# Patient Record
Sex: Male | Born: 1955
Health system: Southern US, Community
[De-identification: ages and names within clinical notes are randomized; demographics above are authoritative.]

## PROBLEM LIST (undated history)

## (undated) DIAGNOSIS — E119 Type 2 diabetes mellitus without complications: Secondary | ICD-10-CM

## (undated) DIAGNOSIS — I10 Essential (primary) hypertension: Secondary | ICD-10-CM

## (undated) HISTORY — DX: Essential (primary) hypertension: I10

## (undated) HISTORY — PX: FRACTURE SURGERY: SHX138

## (undated) HISTORY — DX: Type 2 diabetes mellitus without complications: E11.9

---

## 2013-12-01 DIAGNOSIS — Z0000 Encounter for general adult medical examination without abnormal findings: Secondary | ICD-10-CM | POA: Diagnosis not present

## 2013-12-01 DIAGNOSIS — I10 Essential (primary) hypertension: Secondary | ICD-10-CM | POA: Diagnosis not present

## 2013-12-01 DIAGNOSIS — Z125 Encounter for screening for malignant neoplasm of prostate: Secondary | ICD-10-CM | POA: Diagnosis not present

## 2013-12-01 DIAGNOSIS — Z139 Encounter for screening, unspecified: Secondary | ICD-10-CM | POA: Diagnosis not present

## 2013-12-01 DIAGNOSIS — Z1211 Encounter for screening for malignant neoplasm of colon: Secondary | ICD-10-CM | POA: Diagnosis not present

## 2013-12-01 DIAGNOSIS — Z2889 Immunization not carried out for other reason: Secondary | ICD-10-CM | POA: Diagnosis not present

## 2013-12-01 DIAGNOSIS — Z1339 Encounter for screening examination for other mental health and behavioral disorders: Secondary | ICD-10-CM | POA: Diagnosis not present

## 2013-12-01 DIAGNOSIS — Z1331 Encounter for screening for depression: Secondary | ICD-10-CM | POA: Diagnosis not present

## 2013-12-01 DIAGNOSIS — Z136 Encounter for screening for cardiovascular disorders: Secondary | ICD-10-CM | POA: Diagnosis not present

## 2013-12-01 DIAGNOSIS — E119 Type 2 diabetes mellitus without complications: Secondary | ICD-10-CM | POA: Diagnosis not present

## 2013-12-01 DIAGNOSIS — Z2821 Immunization not carried out because of patient refusal: Secondary | ICD-10-CM | POA: Diagnosis not present

## 2013-12-01 DIAGNOSIS — Z6829 Body mass index (BMI) 29.0-29.9, adult: Secondary | ICD-10-CM | POA: Diagnosis not present

## 2013-12-13 ENCOUNTER — Ambulatory Visit (INDEPENDENT_AMBULATORY_CARE_PROVIDER_SITE_OTHER): Payer: Medicare Other

## 2013-12-13 ENCOUNTER — Ambulatory Visit (INDEPENDENT_AMBULATORY_CARE_PROVIDER_SITE_OTHER): Payer: Medicare Other | Admitting: Internal Medicine

## 2013-12-13 VITALS — BP 140/88 | HR 65 | Temp 98.1°F | Resp 18 | Ht 70.5 in | Wt 208.0 lb

## 2013-12-13 DIAGNOSIS — Z1211 Encounter for screening for malignant neoplasm of colon: Secondary | ICD-10-CM | POA: Diagnosis not present

## 2013-12-13 DIAGNOSIS — M545 Low back pain, unspecified: Secondary | ICD-10-CM

## 2013-12-13 DIAGNOSIS — Z0000 Encounter for general adult medical examination without abnormal findings: Secondary | ICD-10-CM

## 2013-12-13 DIAGNOSIS — Z125 Encounter for screening for malignant neoplasm of prostate: Secondary | ICD-10-CM

## 2013-12-13 DIAGNOSIS — M25579 Pain in unspecified ankle and joints of unspecified foot: Secondary | ICD-10-CM

## 2013-12-13 DIAGNOSIS — M25572 Pain in left ankle and joints of left foot: Secondary | ICD-10-CM

## 2013-12-13 DIAGNOSIS — Z23 Encounter for immunization: Secondary | ICD-10-CM

## 2013-12-13 DIAGNOSIS — G8929 Other chronic pain: Secondary | ICD-10-CM | POA: Diagnosis not present

## 2013-12-13 DIAGNOSIS — E1165 Type 2 diabetes mellitus with hyperglycemia: Secondary | ICD-10-CM

## 2013-12-13 DIAGNOSIS — E119 Type 2 diabetes mellitus without complications: Secondary | ICD-10-CM | POA: Diagnosis not present

## 2013-12-13 LAB — POCT URINALYSIS DIPSTICK
Bilirubin, UA: NEGATIVE
Glucose, UA: 250
Ketones, UA: NEGATIVE
LEUKOCYTES UA: NEGATIVE
NITRITE UA: NEGATIVE
PH UA: 7
Protein, UA: 30
Spec Grav, UA: 1.02
UROBILINOGEN UA: 0.2

## 2013-12-13 LAB — POCT CBC
GRANULOCYTE PERCENT: 58.2 % (ref 37–80)
HCT, POC: 52.1 % (ref 43.5–53.7)
Hemoglobin: 16.7 g/dL (ref 14.1–18.1)
Lymph, poc: 2.7 (ref 0.6–3.4)
MCH: 28.5 pg (ref 27–31.2)
MCHC: 32.1 g/dL (ref 31.8–35.4)
MCV: 88.7 fL (ref 80–97)
MID (CBC): 0.2 (ref 0–0.9)
MPV: 7.5 fL (ref 0–99.8)
PLATELET COUNT, POC: 335 10*3/uL (ref 142–424)
POC Granulocyte: 4.1 (ref 2–6.9)
POC LYMPH %: 38.6 % (ref 10–50)
POC MID %: 3.2 % (ref 0–12)
RBC: 5.87 M/uL (ref 4.69–6.13)
RDW, POC: 13.2 %
WBC: 7.1 10*3/uL (ref 4.6–10.2)

## 2013-12-13 LAB — POCT UA - MICROSCOPIC ONLY
Bacteria, U Microscopic: NEGATIVE
CASTS, UR, LPF, POC: NEGATIVE
Crystals, Ur, HPF, POC: NEGATIVE
EPITHELIAL CELLS, URINE PER MICROSCOPY: NEGATIVE
Yeast, UA: NEGATIVE

## 2013-12-13 LAB — COMPREHENSIVE METABOLIC PANEL
ALK PHOS: 72 U/L (ref 39–117)
ALT: 32 U/L (ref 0–53)
AST: 19 U/L (ref 0–37)
Albumin: 4.7 g/dL (ref 3.5–5.2)
BILIRUBIN TOTAL: 0.7 mg/dL (ref 0.2–1.2)
BUN: 10 mg/dL (ref 6–23)
CO2: 32 mEq/L (ref 19–32)
CREATININE: 0.94 mg/dL (ref 0.50–1.35)
Calcium: 10.5 mg/dL (ref 8.4–10.5)
Chloride: 99 mEq/L (ref 96–112)
Glucose, Bld: 239 mg/dL — ABNORMAL HIGH (ref 70–99)
Potassium: 4.5 mEq/L (ref 3.5–5.3)
Sodium: 137 mEq/L (ref 135–145)
Total Protein: 8 g/dL (ref 6.0–8.3)

## 2013-12-13 LAB — LIPID PANEL
CHOL/HDL RATIO: 3.2 ratio
Cholesterol: 170 mg/dL (ref 0–200)
HDL: 53 mg/dL (ref >39)
LDL CALC: 86 mg/dL (ref 0–99)
Triglycerides: 156 mg/dL — ABNORMAL HIGH (ref <150)
VLDL: 31 mg/dL (ref 0–40)

## 2013-12-13 LAB — POCT GLYCOSYLATED HEMOGLOBIN (HGB A1C): HEMOGLOBIN A1C: 7.6

## 2013-12-13 LAB — GLUCOSE, POCT (MANUAL RESULT ENTRY): POC Glucose: 235 mg/dl — AB (ref 70–99)

## 2013-12-13 LAB — TSH: TSH: 0.937 u[IU]/mL (ref 0.350–4.500)

## 2013-12-13 NOTE — Progress Notes (Signed)
   Subjective:    Patient ID: Hector Wagner, male    DOB: 1955/08/11, 58 y.o.   MRN: [id number]  HPI    Review of Systems     Objective:   Physical Exam        Assessment & Plan:

## 2013-12-13 NOTE — Patient Instructions (Addendum)
Diabetes and Foot Care Diabetes may cause you to have problems because of poor blood supply (circulation) to your feet and legs. This may cause the skin on your feet to become thinner, break easier, and heal more slowly. Your skin may become dry, and the skin may peel and crack. You may also have nerve damage in your legs and feet causing decreased feeling in them. You may not notice minor injuries to your feet that could lead to infections or more serious problems. Taking care of your feet is one of the most important things you can do for yourself.  HOME CARE INSTRUCTIONS  Wear shoes at all times, even in the house. Do not go barefoot. Bare feet are easily injured.  Check your feet daily for blisters, cuts, and redness. If you cannot see the bottom of your feet, use a mirror or ask someone for help.  Wash your feet with warm water (do not use hot water) and mild soap. Then pat your feet and the areas between your toes until they are completely dry. Do not soak your feet as this can dry your skin.  Apply a moisturizing lotion or petroleum jelly (that does not contain alcohol and is unscented) to the skin on your feet and to dry, brittle toenails. Do not apply lotion between your toes.  Trim your toenails straight across. Do not dig under them or around the cuticle. File the edges of your nails with an emery board or nail file.  Do not cut corns or calluses or try to remove them with medicine.  Wear clean socks or stockings every day. Make sure they are not too tight. Do not wear knee-high stockings since they may decrease blood flow to your legs.  Wear shoes that fit properly and have enough cushioning. To break in new shoes, wear them for just a few hours a day. This prevents you from injuring your feet. Always look in your shoes before you put them on to be sure there are no objects inside.  Do not cross your legs. This may decrease the blood flow to your feet.  If you find a minor scrape,  cut, or break in the skin on your feet, keep it and the skin around it clean and dry. These areas may be cleansed with mild soap and water. Do not cleanse the area with peroxide, alcohol, or iodine.  When you remove an adhesive bandage, be sure not to damage the skin around it.  If you have a wound, look at it several times a day to make sure it is healing.  Do not use heating pads or hot water bottles. They may burn your skin. If you have lost feeling in your feet or legs, you may not know it is happening until it is too late.  Make sure your health care provider performs a complete foot exam at least annually or more often if you have foot problems. Report any cuts, sores, or bruises to your health care provider immediately. SEEK MEDICAL CARE IF:   You have an injury that is not healing.  You have cuts or breaks in the skin.  You have an ingrown nail.  You notice redness on your legs or feet.  You feel burning or tingling in your legs or feet.  You have pain or cramps in your legs and feet.  Your legs or feet are numb.  Your feet always feel cold. SEEK IMMEDIATE MEDICAL CARE IF:   There is increasing redness,   swelling, or pain in or around a wound.  There is a red line that goes up your leg.  Pus is coming from a wound.  You develop a fever or as directed by your health care provider.  You notice a bad smell coming from an ulcer or wound. Document Released: 04/05/2000 Document Revised: 12/09/2012 Document Reviewed: 09/15/2012 [facility] Patient Information 2015 [place], [place]. This information is not intended to replace advice given to you by your health care provider. Make sure you discuss any questions you have with your health care provider. Diabetes and Exercise Exercising regularly is important. It is not just about losing weight. It has many health benefits, such as:  Improving your overall fitness, flexibility, and endurance.  Increasing your bone  density.  Helping with weight control.  Decreasing your body fat.  Increasing your muscle strength.  Reducing stress and tension.  Improving your overall health. People with diabetes who exercise gain additional benefits because exercise:  Reduces appetite.  Improves the body's use of blood sugar (glucose).  Helps lower or control blood glucose.  Decreases blood pressure.  Helps control blood lipids (such as cholesterol and triglycerides).  Improves the body's use of the hormone insulin by:  Increasing the body's insulin sensitivity.  Reducing the body's insulin needs.  Decreases the risk for heart disease because exercising:  Lowers cholesterol and triglycerides levels.  Increases the levels of good cholesterol (such as high-density lipoproteins [HDL]) in the body.  Lowers blood glucose levels. YOUR ACTIVITY PLAN  Choose an activity that you enjoy and set realistic goals. Your health care provider or diabetes educator can help you make an activity plan that works for you. Exercise regularly as directed by your health care provider. This includes:  Performing resistance training twice a week such as push-ups, sit-ups, lifting weights, or using resistance bands.  Performing 150 minutes of cardio exercises each week such as walking, running, or playing sports.  Staying active and spending no more than 90 minutes at one time being inactive. Even short bursts of exercise are good for you. Three 10-minute sessions spread throughout the day are just as beneficial as a single 30-minute session. Some exercise ideas include:  Taking the dog for a walk.  Taking the stairs instead of the elevator.  Dancing to your favorite song.  Doing an exercise video.  Doing your favorite exercise with a friend. RECOMMENDATIONS FOR EXERCISING WITH TYPE 1 OR TYPE 2 DIABETES   Check your blood glucose before exercising. If blood glucose levels are greater than 240 mg/dL, check for urine  ketones. Do not exercise if ketones are present.  Avoid injecting insulin into areas of the body that are going to be exercised. For example, avoid injecting insulin into:  The arms when playing tennis.  The legs when jogging.  Keep a record of:  Food intake before and after you exercise.  Expected peak times of insulin action.  Blood glucose levels before and after you exercise.  The type and amount of exercise you have done.  Review your records with your health care provider. Your health care provider will help you to develop guidelines for adjusting food intake and insulin amounts before and after exercising.  If you take insulin or oral hypoglycemic agents, watch for signs and symptoms of hypoglycemia. They include:  Dizziness.  Shaking.  Sweating.  Chills.  Confusion.  Drink plenty of water while you exercise to prevent dehydration or heat stroke. Body water is lost during exercise and must be  replaced.  Talk to your health care provider before starting an exercise program to make sure it is safe for you. Remember, almost any type of activity is better than none. Document Released: 06/29/2003 Document Revised: 08/23/2013 Document Reviewed: 09/15/2012 [facility] Patient Information 2015 [place], [place]. This information is not intended to replace advice given to you by your health care provider. Make sure you discuss any questions you have with your health care provider. Cholesterol Cholesterol is a white, waxy, fat-like substance needed by your body in small amounts. The liver makes all the cholesterol you need. Cholesterol is carried from the liver by the blood through the blood vessels. Deposits of cholesterol (plaque) may build up on blood vessel walls. These make the arteries narrower and stiffer. Cholesterol plaques increase the risk for heart attack and stroke.  You cannot feel your cholesterol level even if it is very high. The only way to know it is high is with a  blood test. Once you know your cholesterol levels, you should keep a record of the test results. Work with your health care provider to keep your levels in the desired range.  WHAT DO THE RESULTS MEAN?  Total cholesterol is a rough measure of all the cholesterol in your blood.   LDL is the so-called bad cholesterol. This is the type that deposits cholesterol in the walls of the arteries. You want this level to be low.   HDL is the good cholesterol because it cleans the arteries and carries the LDL away. You want this level to be high.  Triglycerides are fat that the body can either burn for energy or store. High levels are closely linked to heart disease.  WHAT ARE THE DESIRED LEVELS OF CHOLESTEROL?  Total cholesterol below 200.   LDL below 100 for people at risk, below 70 for those at very high risk.   HDL above 50 is good, above 60 is best.   Triglycerides below 150.  HOW CAN I LOWER MY CHOLESTEROL?  Diet. Follow your diet programs as directed by your health care provider.   Choose fish or white meat chicken and [place], roasted or baked. Limit fatty cuts of red meat, fried foods, and processed meats, such as sausage and lunch meats.   Eat lots of fresh fruits and vegetables.  Choose whole grains, beans, pasta, potatoes, and cereals.   Use only small amounts of olive, corn, or canola oils.   Avoid butter, mayonnaise, shortening, or palm kernel oils.  Avoid foods with trans fats.   Drink skim or nonfat milk and eat low-fat or nonfat yogurt and cheeses. Avoid whole milk, cream, ice cream, egg yolks, and full-fat cheeses.   Healthy desserts include angel food cake, ginger snaps, animal crackers, hard candy, popsicles, and low-fat or nonfat frozen yogurt. Avoid pastries, cakes, pies, and cookies.   Exercise. Follow your exercise programs as directed by your health care provider.   A regular program helps decrease LDL and raise HDL.   A regular program helps  with weight control.   Do things that increase your activity level like gardening, walking, or taking the stairs. Ask your health care provider about how you can be more active in your daily life.   Medicine. Take medicine only as directed by your health care provider.   Medicine may be prescribed by your health care provider to help lower cholesterol and decrease the risk for heart disease.   If you have several risk factors, you may need medicine even if  your levels are normal. Document Released: 01/01/2001 Document Revised: 08/23/2013 Document Reviewed: 01/20/2013 [facility] Patient Information 2015 [place], [place]. This information is not intended to replace advice given to you by your health care provider. Make sure you discuss any questions you have with your health care provider. Colonoscopy A colonoscopy is an exam to look at the entire large intestine (colon). This exam can help find problems such as tumors, polyps, inflammation, and areas of bleeding. The exam takes about 1 hour.  LET [facility] CARE PROVIDER KNOW ABOUT:   Any allergies you have.  All medicines you are taking, including vitamins, herbs, eye drops, creams, and over-the-counter medicines.  Previous problems you or members of your family have had with the use of anesthetics.  Any blood disorders you have.  Previous surgeries you have had.  Medical conditions you have. RISKS AND COMPLICATIONS  Generally, this is a safe procedure. However, as with any procedure, complications can occur. Possible complications include:  Bleeding.  Tearing or rupture of the colon wall.  Reaction to medicines given during the exam.  Infection (rare). BEFORE THE PROCEDURE   Ask your health care provider about changing or stopping your regular medicines.  You may be prescribed an oral bowel prep. This involves drinking a large amount of medicated liquid, starting the day before your procedure. The liquid will cause you to  have multiple loose stools until your stool is almost clear or light green. This cleans out your colon in preparation for the procedure.  Do not eat or drink anything else once you have started the bowel prep, unless your health care provider tells you it is safe to do so.  Arrange for someone to drive you home after the procedure. PROCEDURE   You will be given medicine to help you relax (sedative).  You will lie on your side with your knees bent.  A long, flexible tube with a light and camera on the end (colonoscope) will be inserted through the rectum and into the colon. The camera sends video back to a computer screen as it moves through the colon. The colonoscope also releases carbon dioxide gas to inflate the colon. This helps your health care provider see the area better.  During the exam, your health care provider may take a small tissue sample (biopsy) to be examined under a microscope if any abnormalities are found.  The exam is finished when the entire colon has been viewed. AFTER THE PROCEDURE   Do not drive for 24 hours after the exam.  You may have a small amount of blood in your stool.  You may pass moderate amounts of gas and have mild abdominal cramping or bloating. This is caused by the gas used to inflate your colon during the exam.  Ask when your test results will be ready and how you will get your results. Make sure you get your test results. Document Released: 04/05/2000 Document Revised: 01/27/2013 Document Reviewed: 12/14/2012 [facility] Patient Information 2015 [place], [place]. This information is not intended to replace advice given to you by your health care provider. Make sure you discuss any questions you have with your health care provider.

## 2013-12-13 NOTE — Progress Notes (Signed)
Subjective:    Patient ID: Hector Wagner, male    DOB: 07-Sep-1955, 58 y.o.   MRN: [id number]  HPI Pt is from [place] in the [place]. He went to a clinic there that he can not remember the name of to get his metformin. Has not had a colonoscopy or a PSA before. DDD lumbar spine. Pt takes metformin due to being a T2D says blood sugar stays around the 170's. Pt states he fell from a roof 3 years ago and hurt his left leg. He is using a cane to get around. His left leg bothers him on and off. Underneath the right foot has a burning sensation, feels like he can't rest his foot down. When he starts to walk he no longer feels pain on it. Pt says he hasn't had a tetanus shot in 20 years and has never had an pneumonia vaccine.     His mother is 38 yrs and active, father died 43 yo Review of Systems  Constitutional: Negative.   HENT: Negative.   Eyes: Positive for visual disturbance.  Respiratory: Negative.   Cardiovascular: Negative.   Gastrointestinal: Negative.   Endocrine: Negative.   Genitourinary: Negative.   Musculoskeletal: Positive for arthralgias, back pain and gait problem.  Skin: Negative.   Allergic/Immunologic: Negative.   Neurological: Positive for numbness.  Hematological: Negative.   Psychiatric/Behavioral: Negative.    No fhx of cancer    Objective:   Physical Exam  Vitals reviewed. Constitutional: He is oriented to person, place, and time. He appears well-developed and well-nourished. No distress.  HENT:  Head: Normocephalic.  Right Ear: External ear normal.  Left Ear: External ear normal.  Nose: Nose normal.  Mouth/Throat: Oropharynx is clear and moist.  Eyes: Conjunctivae and EOM are normal. Pupils are equal, round, and reactive to light.  Neck: Normal range of motion. Neck supple. No tracheal deviation present. No thyromegaly present.  Cardiovascular: Normal rate, regular rhythm, normal heart sounds and intact distal pulses.   Pulmonary/Chest: Effort  normal and breath sounds normal.  Abdominal: Soft. Bowel sounds are normal. There is no tenderness.  Genitourinary: Rectum normal, prostate normal and penis normal.  Musculoskeletal: He exhibits tenderness.       Left ankle: He exhibits decreased range of motion, swelling, deformity and abnormal pulse. He exhibits no ecchymosis and no laceration. Tenderness. Achilles tendon normal.       Lumbar back: He exhibits tenderness, bony tenderness, pain and spasm. He exhibits normal range of motion, no swelling, no edema, no deformity, no laceration and normal pulse.  Hx of fx with repair plates and screws 3 yrs ago  Lymphadenopathy:    He has no cervical adenopathy.  Neurological: He is alert and oriented to person, place, and time. He has normal strength. A sensory deficit is present. No cranial nerve deficit. He exhibits normal muscle tone. Coordination and gait abnormal.  Skin: No rash noted.  Psychiatric: He has a normal mood and affect. His behavior is normal. Judgment and thought content normal.   [facility] reading (PRIMARY) by  Dr Leretha Dykes and LS spine normal except for minor scoliosis.  Results for orders placed in visit on 12/13/13  POCT CBC      Result Value Ref Range   WBC 7.1  4.6 - 10.2 K/uL   Lymph, poc 2.7  0.6 - 3.4   POC LYMPH PERCENT 38.6  10 - 50 %L   MID (cbc) 0.2  0 - 0.9   POC MID % 3.2  0 - 12 %M   POC Granulocyte 4.1  2 - 6.9   Granulocyte percent 58.2  37 - 80 %G   RBC 5.87  4.69 - 6.13 M/uL   Hemoglobin 16.7  14.1 - 18.1 g/dL   HCT, POC 52.1  43.5 - 53.7 %   MCV 88.7  80 - 97 fL   MCH, POC 28.5  27 - 31.2 pg   MCHC 32.1  31.8 - 35.4 g/dL   RDW, POC 13.2     Platelet Count, POC 335  142 - 424 K/uL   MPV 7.5  0 - 99.8 fL  GLUCOSE, POCT (MANUAL RESULT ENTRY)      Result Value Ref Range   POC Glucose 235 (*) 70 - 99 mg/dl  POCT GLYCOSYLATED HEMOGLOBIN (HGB A1C)      Result Value Ref Range   Hemoglobin A1C 7.6    POCT UA - MICROSCOPIC ONLY      Result Value Ref  Range   WBC, Ur, HPF, POC 0-2     RBC, urine, microscopic 2-5     Bacteria, U Microscopic neg     Mucus, UA trace     Epithelial cells, urine per micros neg     Crystals, Ur, HPF, POC neg     Casts, Ur, LPF, POC neg     Yeast, UA neg    POCT URINALYSIS DIPSTICK      Result Value Ref Range   Color, UA yellow     Clarity, UA clear     Glucose, UA 250     Bilirubin, UA neg     Ketones, UA neg     Spec Grav, UA 1.020     Blood, UA trace-lysed     pH, UA 7.0     Protein, UA 30     Urobilinogen, UA 0.2     Nitrite, UA neg     Leukocytes, UA Negative            Assessment & Plan:  T2D not to goal Chronic LBP Chronic left ankle pain from old fx  Schedule colonoscopy with GI Rec increase metformin 1g bid Rec start lipitor and 81mg  asa  Discuss with you daughter the nurse

## 2013-12-14 DIAGNOSIS — Z125 Encounter for screening for malignant neoplasm of prostate: Secondary | ICD-10-CM | POA: Diagnosis not present

## 2013-12-14 DIAGNOSIS — Z23 Encounter for immunization: Secondary | ICD-10-CM | POA: Diagnosis not present

## 2013-12-14 DIAGNOSIS — M25579 Pain in unspecified ankle and joints of unspecified foot: Secondary | ICD-10-CM | POA: Diagnosis not present

## 2013-12-14 DIAGNOSIS — Z0000 Encounter for general adult medical examination without abnormal findings: Secondary | ICD-10-CM | POA: Diagnosis not present

## 2013-12-14 DIAGNOSIS — E119 Type 2 diabetes mellitus without complications: Secondary | ICD-10-CM | POA: Diagnosis not present

## 2013-12-14 DIAGNOSIS — M545 Low back pain, unspecified: Secondary | ICD-10-CM | POA: Diagnosis not present

## 2013-12-14 DIAGNOSIS — G8929 Other chronic pain: Secondary | ICD-10-CM | POA: Diagnosis not present

## 2013-12-14 DIAGNOSIS — Z1211 Encounter for screening for malignant neoplasm of colon: Secondary | ICD-10-CM | POA: Diagnosis not present

## 2013-12-14 LAB — PSA, MEDICARE: PSA: 2.65 ng/mL (ref <=4.00)

## 2013-12-14 LAB — IFOBT (OCCULT BLOOD): IMMUNOLOGICAL FECAL OCCULT BLOOD TEST: NEGATIVE

## 2013-12-16 ENCOUNTER — Encounter: Payer: Self-pay | Admitting: Internal Medicine

## 2013-12-17 ENCOUNTER — Ambulatory Visit (INDEPENDENT_AMBULATORY_CARE_PROVIDER_SITE_OTHER): Payer: Medicare Other | Admitting: Internal Medicine

## 2013-12-17 VITALS — BP 140/90 | HR 67 | Temp 98.0°F | Resp 16 | Ht 61.0 in | Wt 208.0 lb

## 2013-12-17 DIAGNOSIS — E1142 Type 2 diabetes mellitus with diabetic polyneuropathy: Secondary | ICD-10-CM | POA: Diagnosis not present

## 2013-12-17 DIAGNOSIS — E1340 Other specified diabetes mellitus with diabetic neuropathy, unspecified: Secondary | ICD-10-CM

## 2013-12-17 DIAGNOSIS — E1140 Type 2 diabetes mellitus with diabetic neuropathy, unspecified: Secondary | ICD-10-CM | POA: Insufficient documentation

## 2013-12-17 DIAGNOSIS — E1149 Type 2 diabetes mellitus with other diabetic neurological complication: Secondary | ICD-10-CM

## 2013-12-17 DIAGNOSIS — G5793 Unspecified mononeuropathy of bilateral lower limbs: Secondary | ICD-10-CM

## 2013-12-17 DIAGNOSIS — Z79899 Other long term (current) drug therapy: Secondary | ICD-10-CM

## 2013-12-17 DIAGNOSIS — G609 Hereditary and idiopathic neuropathy, unspecified: Secondary | ICD-10-CM

## 2013-12-17 DIAGNOSIS — E1165 Type 2 diabetes mellitus with hyperglycemia: Secondary | ICD-10-CM

## 2013-12-17 DIAGNOSIS — IMO0001 Reserved for inherently not codable concepts without codable children: Secondary | ICD-10-CM | POA: Diagnosis not present

## 2013-12-17 LAB — GLUCOSE, POCT (MANUAL RESULT ENTRY): POC Glucose: 154 mg/dl — AB (ref 70–99)

## 2013-12-17 MED ORDER — METFORMIN HCL 1000 MG PO TABS: 1000.0000 mg | ORAL_TABLET | Freq: Two times a day (BID) | ORAL | Status: AC

## 2013-12-17 NOTE — Patient Instructions (Signed)
Metformin tablets What is this medicine? METFORMIN (met FOR min) is used to treat type 2 diabetes. It helps to control blood sugar. Treatment is combined with diet and exercise. This medicine can be used alone or with other medicines for diabetes. This medicine may be used for other purposes; ask your health care provider or pharmacist if you have questions. COMMON BRAND NAME(S): Glucophage What should I tell my health care provider before I take this medicine? They need to know if you have any of these conditions: -anemia -frequently drink alcohol-containing beverages -become easily dehydrated -heart attack -heart failure that is treated with medications -kidney disease -liver disease -polycystic ovary syndrome -serious infection or injury -vomiting -an unusual or allergic reaction to metformin, other medicines, foods, dyes, or preservatives -pregnant or trying to get pregnant -breast-feeding How should I use this medicine? Take this medicine by mouth. Take it with meals. Swallow the tablets with a drink of water. Follow the directions on the prescription label. Take your medicine at regular intervals. Do not take your medicine more often than directed. Talk to your pediatrician regarding the use of this medicine in children. While this drug may be prescribed for children as young as 10 years of age for selected conditions, precautions do apply. Overdosage: If you think you have taken too much of this medicine contact a poison control center or emergency room at once. NOTE: This medicine is only for you. Do not share this medicine with others. What if I miss a dose? If you miss a dose, take it as soon as you can. If it is almost time for your next dose, take only that dose. Do not take double or extra doses. What may interact with this medicine? Do not take this medicine with any of the following medications: -dofetilide -gatifloxacin -certain contrast medicines given before X-rays,  CT scans, MRI, or other procedures This medicine may also interact with the following medications: -digoxin -diuretics -male hormones, like estrogens or progestins and birth control pills -isoniazid -medicines for blood pressure, heart disease, irregular heart beat -morphine -nicotinic acid -phenothiazines like chlorpromazine, mesoridazine, prochlorperazine, thioridazine -phenytoin -procainamide -quinidine -quinine -ranitidine -steroid medicines like prednisone or cortisone -stimulant medicines for attention disorders, weight loss, or to stay awake -thyroid medicines -trimethoprim -vancomycin This list may not describe all possible interactions. Give your health care provider a list of all the medicines, herbs, non-prescription drugs, or dietary supplements you use. Also tell them if you smoke, drink alcohol, or use illegal drugs. Some items may interact with your medicine. What should I watch for while using this medicine? Visit your doctor or health care professional for regular checks on your progress. A test called the HbA1C (A1C) will be monitored. This is a simple blood test. It measures your blood sugar control over the last 2 to 3 months. You will receive this test every 3 to 6 months. Learn how to check your blood sugar. Learn the symptoms of low and high blood sugar and how to manage them. Always carry a quick-source of sugar with you in case you have symptoms of low blood sugar. Examples include hard sugar candy or glucose tablets. Make sure others know that you can choke if you eat or drink when you develop serious symptoms of low blood sugar, such as seizures or unconsciousness. They must get medical help at once. Tell your doctor or health care professional if you have high blood sugar. You might need to change the dose of your medicine. If you are   sick or exercising more than usual, you might need to change the dose of your medicine. Do not skip meals. Ask your doctor or  health care professional if you should avoid alcohol. Many nonprescription cough and cold products contain sugar or alcohol. These can affect blood sugar. This medicine may cause ovulation in premenopausal women who do not have regular monthly periods. This may increase your chances of becoming pregnant. You should not take this medicine if you become pregnant or think you may be pregnant. Talk with your doctor or health care professional about your birth control options while taking this medicine. Contact your doctor or health care professional right away if think you are pregnant. If you are going to need surgery, a MRI, CT scan, or other procedure, tell your doctor that you are taking this medicine. You may need to stop taking this medicine before the procedure. Wear a medical ID bracelet or chain, and carry a card that describes your disease and details of your medicine and dosage times. What side effects may I notice from receiving this medicine? Side effects that you should report to your doctor or health care professional as soon as possible: -allergic reactions like skin rash, itching or hives, swelling of the face, lips, or tongue -breathing problems -feeling faint or lightheaded, falls -muscle aches or pains -signs and symptoms of low blood sugar such as feeling anxious, confusion, dizziness, increased hunger, unusually weak or tired, sweating, shakiness, cold, irritable, headache, blurred vision, fast heartbeat, loss of consciousness -slow or irregular heartbeat -unusual stomach pain or discomfort -unusually tired or weak Side effects that usually do not require medical attention (report to your doctor or health care professional if they continue or are bothersome): -diarrhea -headache -heartburn -metallic taste in mouth -nausea -stomach gas, upset This list may not describe all possible side effects. Call your doctor for medical advice about side effects. You may report side effects  to FDA at 1-800-FDA-1088. Where should I keep my medicine? Keep out of the reach of children. Store at room temperature between 15 and 30 degrees C (59 and 86 degrees F). Protect from moisture and light. Throw away any unused medicine after the expiration date. NOTE: This sheet is a summary. It may not cover all possible information. If you have questions about this medicine, talk to your doctor, pharmacist, or health care provider.  2015, [employer]vier/[employer]. (2012-07-21 16:03:44)  

## 2013-12-17 NOTE — Progress Notes (Signed)
Subjective:    Patient ID: Hector Wagner, male    DOB: 1955/12/17, 58 y.o.   MRN: [id number]  HPI 58 yr old [place] male is here as a follow up for his diabetes.He states he has been checking his glucose twice daily.  Increased dose of metformin has caused a little diarrhea and HA.  Review of Systems     Objective:   Physical Exam  Constitutional: He is oriented to person, place, and time. He appears well-developed and well-nourished.  HENT:  Head: Normocephalic.  Eyes: EOM are normal.  Neck: Normal range of motion.  Cardiovascular: Normal rate, regular rhythm and normal heart sounds.   Pulmonary/Chest: Effort normal and breath sounds normal.  Musculoskeletal: He exhibits tenderness.  Neurological: He is alert and oriented to person, place, and time. No cranial nerve deficit. He exhibits normal muscle tone. Coordination abnormal.  Psychiatric: He has a normal mood and affect. His behavior is normal. Judgment and thought content normal.     Results for orders placed in visit on 12/13/13  COMPREHENSIVE METABOLIC PANEL      Result Value Ref Range   Sodium 137  135 - 145 mEq/L   Potassium 4.5  3.5 - 5.3 mEq/L   Chloride 99  96 - 112 mEq/L   CO2 32  19 - 32 mEq/L   Glucose, Bld 239 (*) 70 - 99 mg/dL   BUN 10  6 - 23 mg/dL   Creat 0.94  0.50 - 1.35 mg/dL   Total Bilirubin 0.7  0.2 - 1.2 mg/dL   Alkaline Phosphatase 72  39 - 117 U/L   AST 19  0 - 37 U/L   ALT 32  0 - 53 U/L   Total Protein 8.0  6.0 - 8.3 g/dL   Albumin 4.7  3.5 - 5.2 g/dL   Calcium 10.5  8.4 - 10.5 mg/dL  TSH      Result Value Ref Range   TSH 0.937  0.350 - 4.500 uIU/mL  LIPID PANEL      Result Value Ref Range   Cholesterol 170  0 - 200 mg/dL   Triglycerides 156 (*) <150 mg/dL   HDL 53  >39 mg/dL   Total CHOL/HDL Ratio 3.2     VLDL 31  0 - 40 mg/dL   LDL Cholesterol 86  0 - 99 mg/dL  PSA, MEDICARE      Result Value Ref Range   PSA 2.65  <=4.00 ng/mL  POCT CBC      Result Value Ref Range   WBC  7.1  4.6 - 10.2 K/uL   Lymph, poc 2.7  0.6 - 3.4   POC LYMPH PERCENT 38.6  10 - 50 %L   MID (cbc) 0.2  0 - 0.9   POC MID % 3.2  0 - 12 %M   POC Granulocyte 4.1  2 - 6.9   Granulocyte percent 58.2  37 - 80 %G   RBC 5.87  4.69 - 6.13 M/uL   Hemoglobin 16.7  14.1 - 18.1 g/dL   HCT, POC 52.1  43.5 - 53.7 %   MCV 88.7  80 - 97 fL   MCH, POC 28.5  27 - 31.2 pg   MCHC 32.1  31.8 - 35.4 g/dL   RDW, POC 13.2     Platelet Count, POC 335  142 - 424 K/uL   MPV 7.5  0 - 99.8 fL  GLUCOSE, POCT (MANUAL RESULT ENTRY)      Result Value  Ref Range   POC Glucose 235 (*) 70 - 99 mg/dl  POCT GLYCOSYLATED HEMOGLOBIN (HGB A1C)      Result Value Ref Range   Hemoglobin A1C 7.6    POCT UA - MICROSCOPIC ONLY      Result Value Ref Range   WBC, Ur, HPF, POC 0-2     RBC, urine, microscopic 2-5     Bacteria, U Microscopic neg     Mucus, UA trace     Epithelial cells, urine per micros neg     Crystals, Ur, HPF, POC neg     Casts, Ur, LPF, POC neg     Yeast, UA neg    POCT URINALYSIS DIPSTICK      Result Value Ref Range   Color, UA yellow     Clarity, UA clear     Glucose, UA 250     Bilirubin, UA neg     Ketones, UA neg     Spec Grav, UA 1.020     Blood, UA trace-lysed     pH, UA 7.0     Protein, UA 30     Urobilinogen, UA 0.2     Nitrite, UA neg     Leukocytes, UA Negative    IFOBT (OCCULT BLOOD)      Result Value Ref Range   IFOBT Negative      Results for orders placed in visit on 12/17/13  GLUCOSE, POCT (MANUAL RESULT ENTRY)      Result Value Ref Range   POC Glucose 154 (*) 70 - 99 mg/dl       Assessment & Plan:  T2D controlled Continue new dose

## 2013-12-21 ENCOUNTER — Ambulatory Visit (AMBULATORY_SURGERY_CENTER): Payer: Medicare Other | Admitting: *Deleted

## 2013-12-21 VITALS — Ht 72.0 in | Wt 213.0 lb

## 2013-12-21 DIAGNOSIS — Z1211 Encounter for screening for malignant neoplasm of colon: Secondary | ICD-10-CM

## 2013-12-21 MED ORDER — MOVIPREP 100 G PO SOLR: 1.0000 | Freq: Once | ORAL | Status: DC

## 2013-12-21 NOTE — Progress Notes (Signed)
No home 02 use at home. ewm No problems with past sedation. ewm No previous colonoscopy. ewm

## 2013-12-28 DIAGNOSIS — H2510 Age-related nuclear cataract, unspecified eye: Secondary | ICD-10-CM | POA: Diagnosis not present

## 2013-12-28 DIAGNOSIS — E1139 Type 2 diabetes mellitus with other diabetic ophthalmic complication: Secondary | ICD-10-CM | POA: Diagnosis not present

## 2013-12-28 DIAGNOSIS — E11329 Type 2 diabetes mellitus with mild nonproliferative diabetic retinopathy without macular edema: Secondary | ICD-10-CM | POA: Diagnosis not present

## 2013-12-30 ENCOUNTER — Other Ambulatory Visit: Payer: Self-pay

## 2013-12-30 ENCOUNTER — Ambulatory Visit (AMBULATORY_SURGERY_CENTER): Payer: Medicare Other | Admitting: Internal Medicine

## 2013-12-30 ENCOUNTER — Encounter: Payer: Self-pay | Admitting: Internal Medicine

## 2013-12-30 ENCOUNTER — Telehealth: Payer: Self-pay

## 2013-12-30 VITALS — BP 140/85 | HR 70 | Temp 98.3°F | Resp 23

## 2013-12-30 DIAGNOSIS — Z1211 Encounter for screening for malignant neoplasm of colon: Secondary | ICD-10-CM | POA: Diagnosis not present

## 2013-12-30 DIAGNOSIS — K629 Disease of anus and rectum, unspecified: Secondary | ICD-10-CM

## 2013-12-30 DIAGNOSIS — D126 Benign neoplasm of colon, unspecified: Secondary | ICD-10-CM | POA: Diagnosis not present

## 2013-12-30 DIAGNOSIS — I10 Essential (primary) hypertension: Secondary | ICD-10-CM | POA: Diagnosis not present

## 2013-12-30 DIAGNOSIS — E669 Obesity, unspecified: Secondary | ICD-10-CM | POA: Diagnosis not present

## 2013-12-30 DIAGNOSIS — E119 Type 2 diabetes mellitus without complications: Secondary | ICD-10-CM | POA: Diagnosis not present

## 2013-12-30 MED ORDER — SODIUM CHLORIDE 0.9 % IV SOLN: 500.0000 mL | INTRAVENOUS | Status: DC

## 2013-12-30 NOTE — Telephone Encounter (Signed)
Pt scheduled to see Dr. Johney Maine with [facility] for eval of anal lesion to exclude dysplastic lesion on 01/12/14@3 :15pm. Pt aware of appt.

## 2013-12-30 NOTE — Progress Notes (Signed)
Report to PACU, RN, vss, BBS= Clear.  

## 2013-12-30 NOTE — Patient Instructions (Signed)
YOU HAD AN ENDOSCOPIC PROCEDURE TODAY AT THE [facility]: Refer to the procedure report that was given to you for any specific questions about what was found during the examination.  If the procedure report does not answer your questions, please call your gastroenterologist to clarify.  If you requested that your care partner not be given the details of your procedure findings, then the procedure report has been included in a sealed envelope for you to review at your convenience later.  YOU SHOULD EXPECT: Some feelings of bloating in the abdomen. Passage of more gas than usual.  Walking can help get rid of the air that was put into your GI tract during the procedure and reduce the bloating. If you had a lower endoscopy (such as a colonoscopy or flexible sigmoidoscopy) you may notice spotting of blood in your stool or on the toilet paper. If you underwent a bowel prep for your procedure, then you may not have a normal bowel movement for a few days.  DIET: Your first meal following the procedure should be a light meal and then it is ok to progress to your normal diet.  A half-sandwich or bowl of soup is an example of a good first meal.  Heavy or fried foods are harder to digest and may make you feel nauseous or bloated.  Likewise meals heavy in dairy and vegetables can cause extra gas to form and this can also increase the bloating.  Drink plenty of fluids but you should avoid alcoholic beverages for 24 hours.  ACTIVITY: Your care partner should take you home directly after the procedure.  You should plan to take it easy, moving slowly for the rest of the day.  You can resume normal activity the day after the procedure however you should NOT DRIVE or use heavy machinery for 24 hours (because of the sedation medicines used during the test).    SYMPTOMS TO REPORT IMMEDIATELY: A gastroenterologist can be reached at any hour.  During normal business hours, 8:30 AM to 5:00 PM Monday through Friday,  call [phone].  After hours and on weekends, please call the GI answering service at [phone] who will take a message and have the physician on call contact you.   Following lower endoscopy (colonoscopy or flexible sigmoidoscopy):  Excessive amounts of blood in the stool  Significant tenderness or worsening of abdominal pains  Swelling of the abdomen that is new, acute  Fever of 100F or higher   FOLLOW UP: If any biopsies were taken you will be contacted by phone or by letter within the next 1-3 weeks.  Call your gastroenterologist if you have not heard about the biopsies in 3 weeks.  Our staff will call the home number listed on your records the next business day following your procedure to check on you and address any questions or concerns that you may have at that time regarding the information given to you following your procedure. This is a courtesy call and so if there is no answer at the home number and we have not heard from you through the emergency physician on call, we will assume that you have returned to your regular daily activities without incident.  SIGNATURES/CONFIDENTIALITY: You and/or your care partner have signed paperwork which will be entered into your electronic medical record.  These signatures attest to the fact that that the information above on your After Visit Summary has been reviewed and is understood.  Full responsibility of the confidentiality of   this discharge information lies with you and/or your care-partner.   Resume medications. Information given on polyps,diverticulosis and high fiber diet with discharge instructions. 

## 2013-12-30 NOTE — Progress Notes (Signed)
Called to room to assist during endoscopic procedure.  Patient ID and intended procedure confirmed with present staff. Received instructions for my participation in the procedure from the performing physician.  

## 2013-12-30 NOTE — Op Note (Signed)
[place]  [address] [place], [id number]   COLONOSCOPY PROCEDURE REPORT  PATIENT: Hector, Wagner  MR#: [id number] BIRTHDATE: [birth year] , 35  yrs. old GENDER: Male ENDOSCOPIST: Jerene Bears, MD REFERRED OY:DXAJO Guest, M.D. PROCEDURE DATE:  12/30/2013 PROCEDURE:   Colonoscopy with cold biopsy polypectomy and Colonoscopy with biopsy First Screening Colonoscopy - Avg.  risk and is 50 yrs.  old or older Yes.  Prior Negative Screening - Now for repeat screening. N/A  History of Adenoma - Now for follow-up colonoscopy & has been > or = to 3 yrs.  N/A  Polyps Removed Today? Yes. ASA CLASS:   Class II INDICATIONS:average risk screening and first colonoscopy. MEDICATIONS: MAC sedation, administered by CRNA and propofol (Diprivan) 250mg  IV  DESCRIPTION OF PROCEDURE:   After the risks benefits and alternatives of the procedure were thoroughly explained, informed consent was obtained. Digital rectal exam revealed small anal canal polyp, flesh-colored and slightly firm.  The LB [id number] endoscope was introduced through the anus and advanced to the cecum, which was identified by both the appendix and ileocecal valve. No adverse events experienced.   The quality of the prep was good, using MoviPrep  The instrument was then slowly withdrawn as the colon was fully examined.  COLON FINDINGS: A sessile polyp measuring 3 mm in size was found in the ascending colon.  A polypectomy was performed with cold forceps.  The resection was complete and the polyp tissue was completely retrieved.   Medium sized lipoma was found in the transverse colon.  Multiple biopsies of the lesion were performed using cold forceps to exclude adenoma.   Mild diverticulosis was noted at the cecum and in the ascending colon.  Retroflexed views revealed no abnormalities. The time to cecum=3 minutes 06 seconds. Withdrawal time=13 minutes 29 seconds.  The scope was withdrawn and the procedure  completed. COMPLICATIONS: There were no complications.  ENDOSCOPIC IMPRESSION: 1.   Sessile polyp measuring 3 mm in size was found in the ascending colon; polypectomy was performed with cold forceps 2.   Medium sized lipoma in the transverse colon; multiple biopsies of the lesion were performed 3.   Mild diverticulosis was noted at the cecum and in the ascending colon 4.   Anal canal lesion, polyp vs. skin tag  RECOMMENDATIONS: 1.  Await pathology results 2.  High fiber diet 3.  Timing of repeat colonoscopy will be determined by pathology findings. 4.  You will receive a letter within 1-2 weeks with the results of your biopsy as well as final recommendations.  Please call my office if you have not received a letter after 3 weeks. 5.  Surgical referral to Dr. Johney Maine to evaluate anal lesion to exclude dysplastic lesion  eSigned:  Jerene Bears, MD 12/30/2013 11:58 AM      cc: The Patient and Lou Miner, MD

## 2013-12-31 ENCOUNTER — Telehealth: Payer: Self-pay | Admitting: *Deleted

## 2013-12-31 NOTE — Telephone Encounter (Signed)
Spoke with wife, she said pt was not awake yet but she would have him call us, advised her if he is doing fine no need to call back, wife asked about surgical referral, she was not with pt yesterday for procedure, advised she would need to speak with pt-adm

## 2014-01-04 ENCOUNTER — Encounter: Payer: Self-pay | Admitting: Internal Medicine

## 2014-01-17 ENCOUNTER — Telehealth: Payer: Self-pay

## 2014-01-17 NOTE — Telephone Encounter (Signed)
Metformin was discussed and increased at CPE.  Do not see mention of HTN- recommended to start Lipitor but medication was not ordered. Please advise.

## 2014-01-17 NOTE — Telephone Encounter (Signed)
Received call from [facility], pt did not show up for his appt with Dr. Johney Maine 01/12/14. Left message for pt to call back and Dr. Hilarie Fredrickson notified.

## 2014-01-17 NOTE — Telephone Encounter (Signed)
Pt needs bp meds,have not been filled by our docs before,but pt had pe here 2-3 wks ago and bp was discussed   Best phone for pt is [phone] [phone]  [facility] outpatient pharmacy

## 2014-01-18 ENCOUNTER — Ambulatory Visit (INDEPENDENT_AMBULATORY_CARE_PROVIDER_SITE_OTHER): Payer: Medicare Other | Admitting: Family Medicine

## 2014-01-18 ENCOUNTER — Encounter: Payer: Self-pay | Admitting: Family Medicine

## 2014-01-18 VITALS — BP 160/82 | HR 74 | Temp 97.5°F | Resp 18 | Ht 71.0 in | Wt 206.6 lb

## 2014-01-18 DIAGNOSIS — E119 Type 2 diabetes mellitus without complications: Secondary | ICD-10-CM | POA: Diagnosis not present

## 2014-01-18 DIAGNOSIS — I10 Essential (primary) hypertension: Secondary | ICD-10-CM | POA: Diagnosis not present

## 2014-01-18 DIAGNOSIS — M545 Low back pain, unspecified: Secondary | ICD-10-CM | POA: Diagnosis not present

## 2014-01-18 NOTE — Progress Notes (Signed)
Is a 58 year old gentleman who originally is from [place] and is disabled because of back pain. Comes in for some medicine for his back which continues to hurt despite using a back brace. He's also here for hypertension and diabetes.  Patient is brought in his medications recently. He usually uses ibuprofen for his back. He's had hypertension for the last year. He's had diabetes for 6 years. He says back pain for 15 years. He was told Richard a crushed vertebrae. He says the pain radiates into his right hip.  2 diabetes has been largely controlled by metformin 1000 mg twice a day. His blood sugars run around 160-170. He says he does have occasional left thigh pain is recently had his eyes checked and that was normal.  He takes 5 mg of amlodipine daily for his hypertension until recently.  He's had some heel pain on the right heel which goes away when he walks. This is been intermittent and unrelated to any skin changes.  Objective: HEENT: Unremarkable Chest: Clear Heart: Regular no murmur Abdomen: Soft nontender. Extremities: Normal appearing feet with good sensation and good pulses. Straight-leg raising is normal, there is no loss of Assessment: Chronic diabetes, hypertension and back pain. Is been no significant change over the last interval. He should be checked every 3 months.  Plan: Recheck in December For back pain Voltaren 75 mg #180, one twice a day with one refill #2 diabetes-metformin 1000 twice a day #180, one twice a day with one refill #3 hypertension-amlodipine 5 mg daily #90, one refill  Signed, Carola Frost.D.

## 2014-01-18 NOTE — Telephone Encounter (Signed)
Pts wife called back and states that the pt is getting ready to go out of the country and he did not want to have anything done at this point. States the area is not bothering him and he did not want to do anything at this point. Dr. Hilarie Fredrickson notified.

## 2014-01-21 NOTE — Telephone Encounter (Signed)
Return to clinic and see a doctor or see your own family doctor

## 2014-09-27 DIAGNOSIS — M71571 Other bursitis, not elsewhere classified, right ankle and foot: Secondary | ICD-10-CM | POA: Diagnosis not present

## 2014-09-27 DIAGNOSIS — M722 Plantar fascial fibromatosis: Secondary | ICD-10-CM | POA: Diagnosis not present

## 2014-09-27 DIAGNOSIS — M7731 Calcaneal spur, right foot: Secondary | ICD-10-CM | POA: Diagnosis not present

## 2014-10-04 DIAGNOSIS — M71571 Other bursitis, not elsewhere classified, right ankle and foot: Secondary | ICD-10-CM | POA: Diagnosis not present

## 2014-10-04 DIAGNOSIS — M722 Plantar fascial fibromatosis: Secondary | ICD-10-CM | POA: Diagnosis not present

## 2014-10-10 DIAGNOSIS — M659 Synovitis and tenosynovitis, unspecified: Secondary | ICD-10-CM | POA: Diagnosis not present

## 2014-10-10 DIAGNOSIS — M25551 Pain in right hip: Secondary | ICD-10-CM | POA: Diagnosis not present

## 2014-10-10 DIAGNOSIS — M545 Low back pain: Secondary | ICD-10-CM | POA: Diagnosis not present

## 2015-07-10 IMAGING — CR DG CHEST 2V
2 series · 2 of 2 positions shown · non-contrast
Comparison: None.

CLINICAL DATA: History of diabetes and hypertension

EXAM:
CHEST  2 VIEW

[PA]
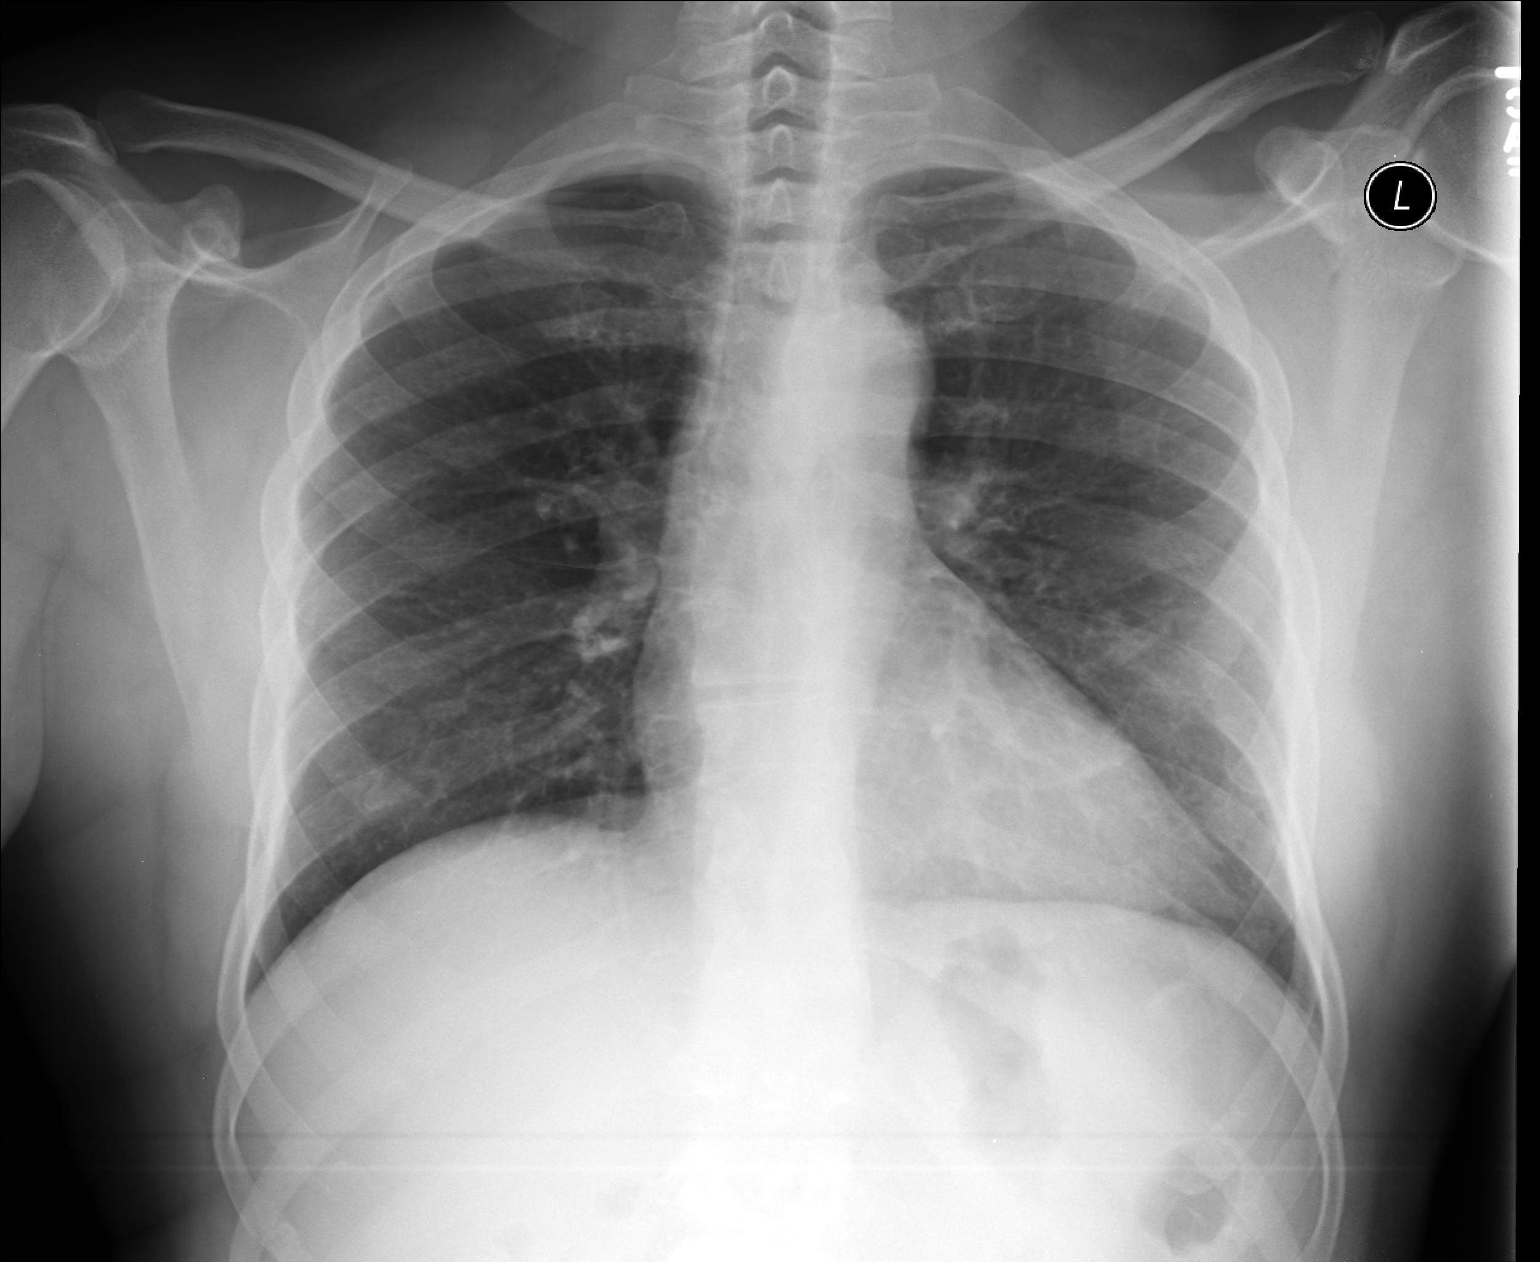

[lateral]
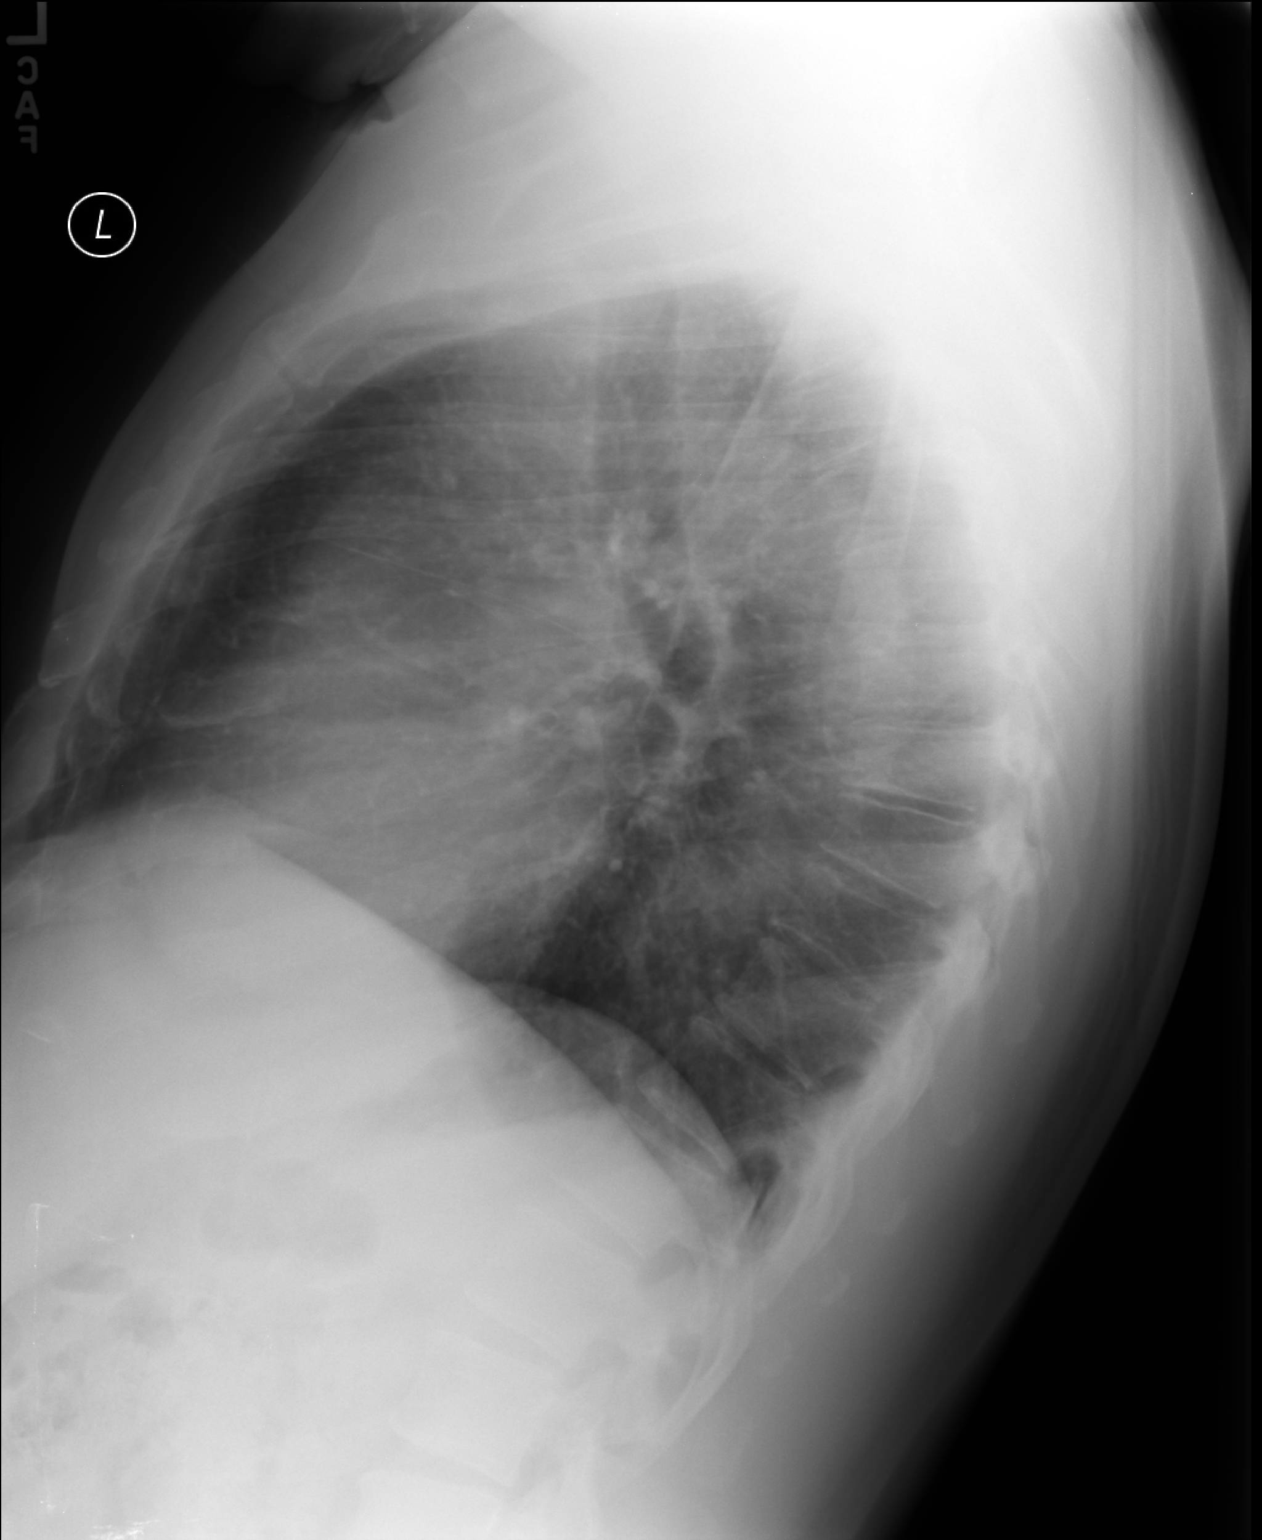

[2 of 2 positions shown; findings below may reference images not displayed]

FINDINGS: The lungs are adequately inflated. There is no focal infiltrate. The
heart and pulmonary vascularity are within the limits of normal.
Slightly increased lung markings lateral to the left heart border
are likely related to overlying costal cartilage calcification.
There is no pleural effusion. The mediastinum is normal in width.
The bony thorax is unremarkable.
IMPRESSION: There is no active cardiopulmonary disease.

## 2015-12-19 DIAGNOSIS — I10 Essential (primary) hypertension: Secondary | ICD-10-CM | POA: Diagnosis not present

## 2015-12-19 DIAGNOSIS — R7989 Other specified abnormal findings of blood chemistry: Secondary | ICD-10-CM | POA: Diagnosis not present

## 2015-12-19 DIAGNOSIS — Z0000 Encounter for general adult medical examination without abnormal findings: Secondary | ICD-10-CM | POA: Diagnosis not present

## 2018-10-13 ENCOUNTER — Encounter: Payer: Self-pay | Admitting: *Deleted

## 2018-11-20 ENCOUNTER — Encounter: Payer: Self-pay | Admitting: Internal Medicine

## 2023-10-03 ENCOUNTER — Encounter (HOSPITAL_COMMUNITY): Payer: Self-pay | Admitting: Emergency Medicine

## 2023-10-03 ENCOUNTER — Emergency Department (HOSPITAL_COMMUNITY)
Admission: EM | Admit: 2023-10-03 | Discharge: 2023-10-04 | Disposition: A | Discharge status: Home / Self Care | Attending: Emergency Medicine | Admitting: Emergency Medicine

## 2023-10-03 DIAGNOSIS — G253 Myoclonus: Secondary | ICD-10-CM | POA: Diagnosis not present

## 2023-10-03 DIAGNOSIS — D75839 Thrombocytosis, unspecified: Secondary | ICD-10-CM | POA: Diagnosis not present

## 2023-10-03 DIAGNOSIS — M62838 Other muscle spasm: Secondary | ICD-10-CM | POA: Diagnosis present

## 2023-10-03 DIAGNOSIS — E119 Type 2 diabetes mellitus without complications: Secondary | ICD-10-CM | POA: Insufficient documentation

## 2023-10-03 DIAGNOSIS — Z7982 Long term (current) use of aspirin: Secondary | ICD-10-CM | POA: Diagnosis not present

## 2023-10-03 DIAGNOSIS — I10 Essential (primary) hypertension: Secondary | ICD-10-CM | POA: Insufficient documentation

## 2023-10-03 DIAGNOSIS — Z7984 Long term (current) use of oral hypoglycemic drugs: Secondary | ICD-10-CM | POA: Diagnosis not present

## 2023-10-03 NOTE — ED Triage Notes (Signed)
 Pt having muscle spasms all over starting 0830pm. Denies any other symptoms except chest discomfort at times. Spasms are tonic clonic in nature and pt denies any stressful event at onset.

## 2023-10-04 ENCOUNTER — Other Ambulatory Visit: Payer: Self-pay

## 2023-10-04 LAB — COMPREHENSIVE METABOLIC PANEL WITH GFR
ALT: 53 U/L — ABNORMAL HIGH (ref 0–44)
AST: 28 U/L (ref 15–41)
Albumin: 3.7 g/dL (ref 3.5–5.0)
Alkaline Phosphatase: 66 U/L (ref 38–126)
Anion gap: 9 (ref 5–15)
BUN: 18 mg/dL (ref 8–23)
CO2: 26 mmol/L (ref 22–32)
Calcium: 9.7 mg/dL (ref 8.9–10.3)
Chloride: 101 mmol/L (ref 98–111)
Creatinine, Ser: 0.78 mg/dL (ref 0.61–1.24)
GFR, Estimated: >60 mL/min (ref >60)
Glucose, Bld: 233 mg/dL — ABNORMAL HIGH (ref 70–99)
Potassium: 3.7 mmol/L (ref 3.5–5.1)
Sodium: 136 mmol/L (ref 135–145)
Total Bilirubin: 0.7 mg/dL (ref 0.0–1.2)
Total Protein: 7 g/dL (ref 6.5–8.1)

## 2023-10-04 LAB — CBC WITH DIFFERENTIAL/PLATELET
Abs Immature Granulocytes: 0.01 10*3/uL (ref 0.00–0.07)
Basophils Absolute: 0.1 10*3/uL (ref 0.0–0.1)
Basophils Relative: 1 %
Eosinophils Absolute: 0.4 10*3/uL (ref 0.0–0.5)
Eosinophils Relative: 4 %
HCT: 45.4 % (ref 39.0–52.0)
Hemoglobin: 15 g/dL (ref 13.0–17.0)
Immature Granulocytes: 0 %
Lymphocytes Relative: 37 %
Lymphs Abs: 3 10*3/uL (ref 0.7–4.0)
MCH: 29.1 pg (ref 26.0–34.0)
MCHC: 33 g/dL (ref 30.0–36.0)
MCV: 88.2 fL (ref 80.0–100.0)
Monocytes Absolute: 0.9 10*3/uL (ref 0.1–1.0)
Monocytes Relative: 12 %
Neutro Abs: 3.8 10*3/uL (ref 1.7–7.7)
Neutrophils Relative %: 46 %
Platelets: 979 10*3/uL (ref 150–400)
RBC: 5.15 MIL/uL (ref 4.22–5.81)
RDW: 12.8 % (ref 11.5–15.5)
WBC: 8.1 10*3/uL (ref 4.0–10.5)
nRBC: 0 % (ref 0.0–0.2)

## 2023-10-04 MED ORDER — LORAZEPAM 1 MG PO TABS
1.0000 mg | ORAL_TABLET | Freq: Once | ORAL | Status: AC
  Administered 2023-10-04: 1 mg via ORAL
  Filled 2023-10-04: qty 1

## 2023-10-04 MED ORDER — POTASSIUM CHLORIDE CRYS ER 20 MEQ PO TBCR: 20.0000 meq | EXTENDED_RELEASE_TABLET | Freq: Every day | ORAL | 0 refills | Status: AC

## 2023-10-04 MED ORDER — POTASSIUM CHLORIDE CRYS ER 20 MEQ PO TBCR
40.0000 meq | EXTENDED_RELEASE_TABLET | Freq: Once | ORAL | Status: AC
  Administered 2023-10-04: 40 meq via ORAL
  Filled 2023-10-04: qty 2

## 2023-10-04 MED ORDER — LORAZEPAM 1 MG PO TABS: 0.5000 mg | ORAL_TABLET | Freq: Three times a day (TID) | ORAL | 0 refills | Status: AC | PRN

## 2023-10-04 NOTE — ED Provider Notes (Signed)
 [place] EMERGENCY DEPARTMENT AT [facility] [place] Provider Note   CSN: [id number] Arrival date & time: 10/03/23  2324     Patient presents with: Spasms   Hector Wagner is a 68 y.o. male.   The history is provided by the patient.  He has history of hypertension, diabetes and comes in because of muscle spasms.  He has been having the spasms for the last 2 years, but tonight is worse.  He did see his primary care provider who prescribed tizanidine, but he took a dose today and it is not helping.   Prior to Admission medications   Medication Sig Start Date End Date Taking? Authorizing Provider  LORazepam (ATIVAN) 1 MG tablet Take 0.5-1 tablets (0.5-1 mg total) by mouth every 8 (eight) hours as needed for anxiety. 10/04/23  Yes Alissa April, MD  potassium chloride SA (KLOR-CON M) 20 MEQ tablet Take 1 tablet (20 mEq total) by mouth daily. 10/04/23  Yes Alissa April, MD  aspirin 81 MG tablet Take 81 mg by mouth daily.    [provider]  ibuprofen (ADVIL,MOTRIN) 800 MG tablet Take 800 mg by mouth 3 (three) times daily.    [provider]  metFORMIN  (GLUCOPHAGE ) 1000 MG tablet Take 1 tablet (1,000 mg total) by mouth 2 (two) times daily with a meal. 12/17/13   Guest, Dorthea Gauze, MD    Allergies: Patient has no known allergies.    Review of Systems  All other systems reviewed and are negative.   Updated Vital Signs BP 132/85 (BP Location: Left Arm)   Pulse 72   Temp 98.2 F (36.8 C) (Oral)   Resp 18   SpO2 96%   Physical Exam Vitals and nursing note reviewed.   68 year old male, resting comfortably and in no acute distress. Vital signs are significant for elevated blood pressure. Oxygen saturation is 100%, which is normal. Head is normocephalic and atraumatic. PERRLA, EOMI.  Lungs are clear without rales, wheezes, or rhonchi. Chest is nontender. Heart has regular rate and rhythm without murmur. Abdomen is soft, flat, nontender. Extremities have no cyanosis  or edema, full range of motion is present.  Muscle tone is normal. Skin is warm and dry without rash. Neurologic: Awake and alert, moves all extremities equally.  Involuntary myoclonic jerks are noted intermittently.  (all labs ordered are listed, but only abnormal results are displayed) Labs Reviewed  COMPREHENSIVE METABOLIC PANEL WITH GFR - Abnormal; Notable for the following components:      Result Value   Glucose, Bld 233 (*)    ALT 53 (*)    All other components within normal limits  CBC WITH DIFFERENTIAL/PLATELET - Abnormal; Notable for the following components:   Platelets 979 (*)    All other components within normal limits    EKG: EKG Interpretation Date/Time:  Saturday October 04 2023 00:02:42 EDT Ventricular Rate:  78 PR Interval:  163 QRS Duration:  96 QT Interval:  382 QTC Calculation: 436 R Axis:   -29  Text Interpretation: Sinus rhythm Consider left atrial enlargement Borderline left axis deviation Probable anteroseptal infarct, old No old tracing to compare Confirmed by Alissa April ([id number]) on 10/04/2023 12:21:02 AM  Radiology: No results found.   Procedures   Medications Ordered in the ED  potassium chloride SA (KLOR-CON M) CR tablet 40 mEq (40 mEq Oral Given 10/04/23 0340)  LORazepam (ATIVAN) tablet 1 mg (1 mg Oral Given 10/04/23 0340)  Medical Decision Making Amount and/or Complexity of Data Reviewed Labs: ordered.  Risk Prescription drug management.   Myoclonic jerks, no evidence of actual muscle spasm.  I reviewed his electrocardiogram, and my interpretation is borderline left axis deviation, possible old anteroseptal myocardial infarction with no prior ECG available for comparison.  I have reviewed his laboratory test, my interpretation is mildly elevated ALT of uncertain clinical significance, marked thrombocytosis.  I have reviewed his past records, and the only CBC on record was on 05/07/2019 at which time platelet  count was 535 compared with 979 today.  This is concerning for essential thrombocytosis, but workup can be done as an outpatient.  I note nuclear stress test on 03/06/2022 which showed no ST or T changes and no evidence of inducible ischemia with nuclear imaging.  I also note office visit on 09/26/2023 at which time he was given a prescription for tizanidine and referred to neurology.  Although his potassium is normal, it is less than 4 and with muscle issues it would be preferable to maintain the potassium above 4 so I have ordered a dose of oral potassium.  I have also ordered a dose of lorazepam as a therapeutic trial.  He fell asleep following lorazepam, has had significant decrease in his myoclonic jerking but it is not completely gone.  I am discharging him with a prescription for a lower dose of lorazepam.  His primary care provider had put in for neurology referral, he will need to follow-up with his neurologist.  I am referring him to the cancer center for evaluation of his thrombocytosis.  Final diagnoses:  Myoclonic jerking  Thrombocytosis    ED Discharge Orders          Ordered    Ambulatory referral to Hematology / Oncology       Comments: Your emergency department provider has referred you to see a hematology/oncology specialist. These are physicians who specialize in blood disorders and cancers, or findings concerning for cancer. You will receive a phone call from the [facility] Office to set up your appointment within 2 business days: [employer] operate Mon - Fri, 8:00 a.m. to 5:00 p.m.; closed for federally recognized holidays. Please be sure your phone is not set to block numbers during this time.   10/04/23 0336    LORazepam (ATIVAN) 1 MG tablet  Every 8 hours PRN        10/04/23 0526    potassium chloride SA (KLOR-CON M) 20 MEQ tablet  Daily        10/04/23 0526               Alissa April, MD 10/04/23 [phone]

## 2023-10-04 NOTE — Discharge Instructions (Addendum)
 Stop taking tizanidine, since it is not helping.  You may take 1-2 lorazepam tablets as often as 3 times a day to help with the jerking.  Please follow-up with the neurologist your primary care provider had referred you to.  Please make sure to follow-up at the cancer center for further evaluation of your high platelet count.  Return to the emergency department if symptoms or not being adequately controlled at home.
